# Patient Record
Sex: Male | Born: 1980 | Race: White | Hispanic: No | Marital: Married | Smoking: Current every day smoker
Health system: Southern US, Community
[De-identification: ages and names within clinical notes are randomized; demographics above are authoritative.]

---

## 2004-03-24 ENCOUNTER — Emergency Department: Payer: Self-pay | Admitting: Emergency Medicine

## 2004-08-30 ENCOUNTER — Emergency Department: Payer: Self-pay | Admitting: Emergency Medicine

## 2011-08-29 ENCOUNTER — Emergency Department: Payer: Self-pay | Admitting: Emergency Medicine

## 2013-06-04 ENCOUNTER — Emergency Department: Payer: Self-pay | Admitting: Emergency Medicine

## 2014-11-16 ENCOUNTER — Ambulatory Visit: Payer: Self-pay | Admitting: Family Medicine

## 2014-12-03 ENCOUNTER — Ambulatory Visit: Payer: Self-pay | Admitting: Family Medicine

## 2019-08-06 ENCOUNTER — Emergency Department: Payer: Medicaid Other

## 2019-08-06 ENCOUNTER — Encounter: Payer: Self-pay | Admitting: Emergency Medicine

## 2019-08-06 ENCOUNTER — Other Ambulatory Visit: Payer: Self-pay

## 2019-08-06 ENCOUNTER — Emergency Department
Admission: EM | Admit: 2019-08-06 | Discharge: 2019-08-06 | Disposition: A | Discharge status: Home / Self Care | Payer: Medicaid Other | Attending: Student in an Organized Health Care Education/Training Program | Admitting: Student in an Organized Health Care Education/Training Program

## 2019-08-06 DIAGNOSIS — W208XXA Other cause of strike by thrown, projected or falling object, initial encounter: Secondary | ICD-10-CM | POA: Insufficient documentation

## 2019-08-06 DIAGNOSIS — Y929 Unspecified place or not applicable: Secondary | ICD-10-CM | POA: Insufficient documentation

## 2019-08-06 DIAGNOSIS — F17210 Nicotine dependence, cigarettes, uncomplicated: Secondary | ICD-10-CM | POA: Diagnosis not present

## 2019-08-06 DIAGNOSIS — S9032XA Contusion of left foot, initial encounter: Secondary | ICD-10-CM | POA: Diagnosis not present

## 2019-08-06 DIAGNOSIS — Y9389 Activity, other specified: Secondary | ICD-10-CM | POA: Diagnosis not present

## 2019-08-06 DIAGNOSIS — Y999 Unspecified external cause status: Secondary | ICD-10-CM | POA: Diagnosis not present

## 2019-08-06 DIAGNOSIS — Z23 Encounter for immunization: Secondary | ICD-10-CM | POA: Insufficient documentation

## 2019-08-06 DIAGNOSIS — S99922A Unspecified injury of left foot, initial encounter: Secondary | ICD-10-CM | POA: Diagnosis present

## 2019-08-06 DIAGNOSIS — B353 Tinea pedis: Secondary | ICD-10-CM

## 2019-08-06 MED ORDER — CEPHALEXIN 500 MG PO CAPS: 1000.0000 mg | ORAL_CAPSULE | Freq: Two times a day (BID) | ORAL | 0 refills | Status: DC

## 2019-08-06 MED ORDER — TETANUS-DIPHTH-ACELL PERTUSSIS 5-2.5-18.5 LF-MCG/0.5 IM SUSP
0.5000 mL | Freq: Once | INTRAMUSCULAR | Status: AC
  Administered 2019-08-06: 0.5 mL via INTRAMUSCULAR
  Filled 2019-08-06: qty 0.5

## 2019-08-06 MED ORDER — MELOXICAM 15 MG PO TABS: 15.0000 mg | ORAL_TABLET | Freq: Every day | ORAL | 0 refills | Status: DC

## 2019-08-06 NOTE — Discharge Instructions (Signed)
Use athlete's foot cream for at least 6 months to the bottom of your foot.  This will contain an "azole" medication, likely ketoconazole.  Use this daily for at least 6 months.

## 2019-08-06 NOTE — ED Triage Notes (Signed)
Pt to ED from home c/o left foot injury tonight.  States was unloading a safe out of friend's car when it fell on foot.

## 2019-08-06 NOTE — ED Notes (Signed)
Called pharm as ED flex pyxis currently out of Tdap shots. State they will tube soon.

## 2019-08-06 NOTE — ED Provider Notes (Signed)
[facility] Emergency Department Provider Note  ____________________________________________  Time seen: Approximately 10:36 PM  I have reviewed the triage vital signs and the nursing notes.   HISTORY  Chief Complaint Foot Injury    HPI Luis Francis is a 39 y.o. male who presents the emergency department for evaluation of left foot injury.  Patient states that he was helping a buddy move a large safe door when it fell striking his foot.  Patient sustained an abrasion along the medial aspect of the foot, had bruising and pain diffusely through the midfoot.  Patient states that he was able to stand on it but doing so increase the pain.  No loss of sensation.  No other injury or complaint.  Patient is unsure of his last tetanus shot.         History reviewed. No pertinent past medical history.  There are no problems to display for this patient.   History reviewed. No pertinent surgical history.  Prior to Admission medications   Medication Sig Start Date End Date Taking? Authorizing Provider  cephALEXin (KEFLEX) 500 MG capsule Take 2 capsules (1,000 mg total) by mouth 2 (two) times daily. 08/06/19   Cuthriell, Charline Bills, PA-C  meloxicam (MOBIC) 15 MG tablet Take 1 tablet (15 mg total) by mouth daily. 08/06/19   Cuthriell, Charline Bills, PA-C    Allergies Patient has no known allergies.  History reviewed. No pertinent family history.  Social History Social History   Tobacco Use  . Smoking status: Current Every Day Smoker    Packs/day: 1.00    Types: Cigarettes  . Smokeless tobacco: Former Network engineer Use Topics  . Alcohol use: Never  . Drug use: Never     Review of Systems  Constitutional: No fever/chills Eyes: No visual changes. No discharge ENT: No upper respiratory complaints. Cardiovascular: no chest pain. Respiratory: no cough. No SOB. Gastrointestinal: No abdominal pain.  No nausea, no vomiting.  No diarrhea.  No  constipation. Musculoskeletal: Positive for left foot injury Skin: Negative for rash, abrasions, lacerations, ecchymosis. Neurological: Negative for headaches, focal weakness or numbness. 10-point ROS otherwise negative.  ____________________________________________   PHYSICAL EXAM:  VITAL SIGNS: ED Triage Vitals [08/06/19 2128]  Enc Vitals Group     BP 112/88     Pulse Rate (!) 116     Resp 18     Temp 98.9 F (37.2 C)     Temp Source Oral     SpO2 97 %     Weight 130 lb (59 kg)     Height 5\' 6"  (1.676 m)     Head Circumference      Peak Flow      Pain Score 10     Pain Loc      Pain Edu?      Excl. in [place]?      Constitutional: Alert and oriented. Well appearing and in no acute distress. Eyes: Conjunctivae are normal. PERRL. EOMI. Head: Atraumatic. ENT:      Ears:       Nose: No congestion/rhinnorhea.      Mouth/Throat: Mucous membranes are moist.  Neck: No stridor.    Cardiovascular: Normal rate, regular rhythm. Normal S1 and S2.  Good peripheral circulation. Respiratory: Normal respiratory effort without tachypnea or retractions. Lungs CTAB. Good air entry to the bases with no decreased or absent breath sounds. Musculoskeletal: Full range of motion to all extremities. No gross deformities appreciated.  Visualization of the left foot reveals  superficial abrasion along the medial aspect.  This is linear in nature and measures approximately 2 cm in length.  No bleeding, no foreign body.  Patient does have some mild edema and ecchymosis extending through the midfoot region.  No deformities.  Patient is able to move all digits appropriately.  Has significant tenderness diffusely across the metatarsals with no palpable abnormality.  Dorsalis pedis pulse and capillary refill intact.  Sensation intact all digits.  Incidental finding along the plantar aspect reveals findings consistent with tinea pedis. Neurologic:  Normal speech and language. No gross focal neurologic deficits  are appreciated.  Skin:  Skin is warm, dry and intact. No rash noted. Psychiatric: Mood and affect are normal. Speech and behavior are normal. Patient exhibits appropriate insight and judgement.   ____________________________________________   LABS (all labs ordered are listed, but only abnormal results are displayed)  Labs Reviewed - No data to display ____________________________________________  EKG   ____________________________________________  RADIOLOGY I personally viewed and evaluated these images as part of my medical decision making, as well as reviewing the written report by the radiologist.  DG Foot Complete Left  Result Date: 08/06/2019 CLINICAL DATA:  Left foot injury tonight EXAM: LEFT FOOT - COMPLETE 3+ VIEW COMPARISON:  None. FINDINGS: No fracture or dislocation. Normal bone mineralization seen throughout. Mild dorsal soft tissue swelling. IMPRESSION: No acute osseous abnormality. Electronically Signed   By: Jonna Clark M.D.   On: 08/06/2019 22:02    ____________________________________________    PROCEDURES  Procedure(s) performed:    Procedures    Medications  Tdap (BOOSTRIX) injection 0.5 mL (has no administration in time range)     ____________________________________________   INITIAL IMPRESSION / ASSESSMENT AND PLAN / ED COURSE  Pertinent labs & imaging results that were available during my care of the patient were reviewed by me and considered in my medical decision making (see chart for details).  Review of the [place] CSRS was performed in accordance of the [facility] prior to dispensing any controlled drugs.           Patient's diagnosis is consistent with foot contusion, tinea pedis.  Patient presented to the emergency department with complaint of left foot injury.  X-ray reveals no acute fractures.  Exam was otherwise reassuring.  Patient had a superficial laceration that did not require closure in the emergency department.  Patient will be  placed on antibiotics prophylactically in tetanus shot administered at this time.  Wound care instructions discussed with patient.  I will place the patient on meloxicam at home for symptom relief.  Patient had findings consistent with tinea pedis.  Thankfully this is localized to the plantar aspect and is not significant.  I advised the patient to use over-the-counter topical medication containing ketoconazole for improvement..  Patient is given ED precautions to return to the ED for any worsening or new symptoms.     ____________________________________________  FINAL CLINICAL IMPRESSION(S) / ED DIAGNOSES  Final diagnoses:  Contusion of left foot, initial encounter  Tinea pedis of left foot      NEW MEDICATIONS STARTED DURING THIS VISIT:  ED Discharge Orders         Ordered    meloxicam (MOBIC) 15 MG tablet  Daily     08/06/19 2248    cephALEXin (KEFLEX) 500 MG capsule  2 times daily     08/06/19 2248              This chart was dictated using voice recognition software/Dragon. Despite best  efforts to proofread, errors can occur which can change the meaning. Any change was purely unintentional.    Racheal Patches, PA-C 08/06/19 2249    Willy Eddy, MD 08/06/19 [phone]

## 2021-07-10 IMAGING — CR DG FOOT COMPLETE 3+V*L*
1 series · 3 of 3 positions shown · non-contrast
Comparison: None.

CLINICAL DATA: Left foot injury tonight

EXAM:
LEFT FOOT - COMPLETE 3+ VIEW

[Series 1: dg foot complete left · 0.14mm/px · 3 of 3 slices shown]
[im 1/3]
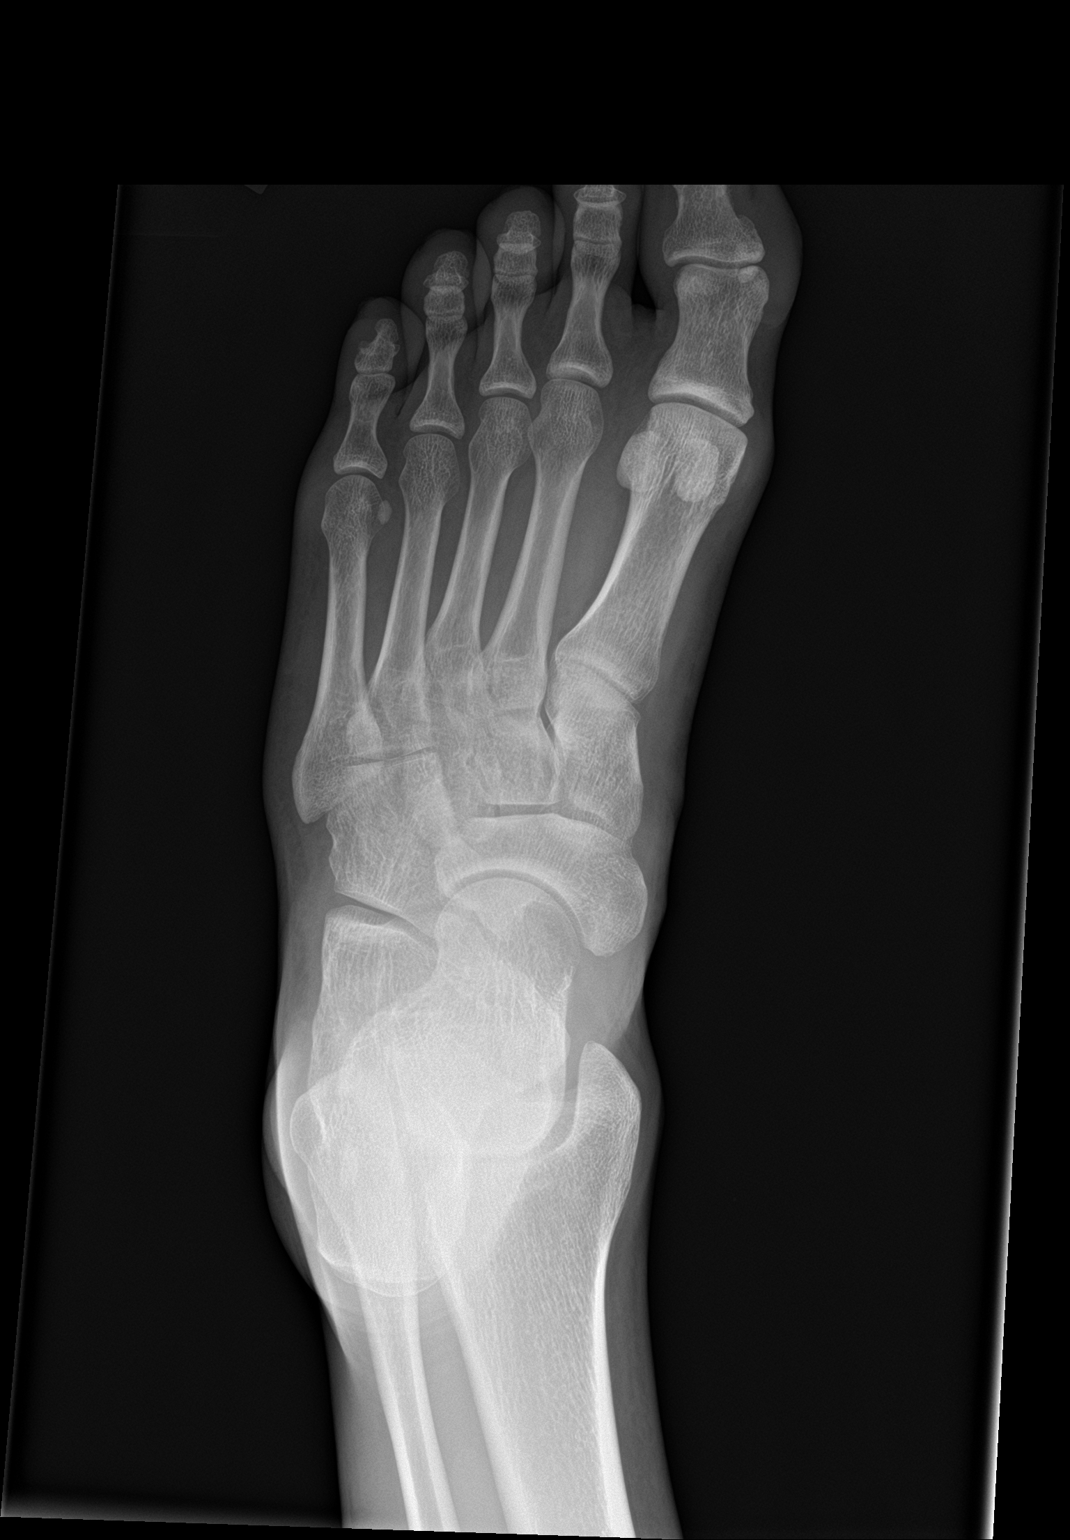
[im 2/3]
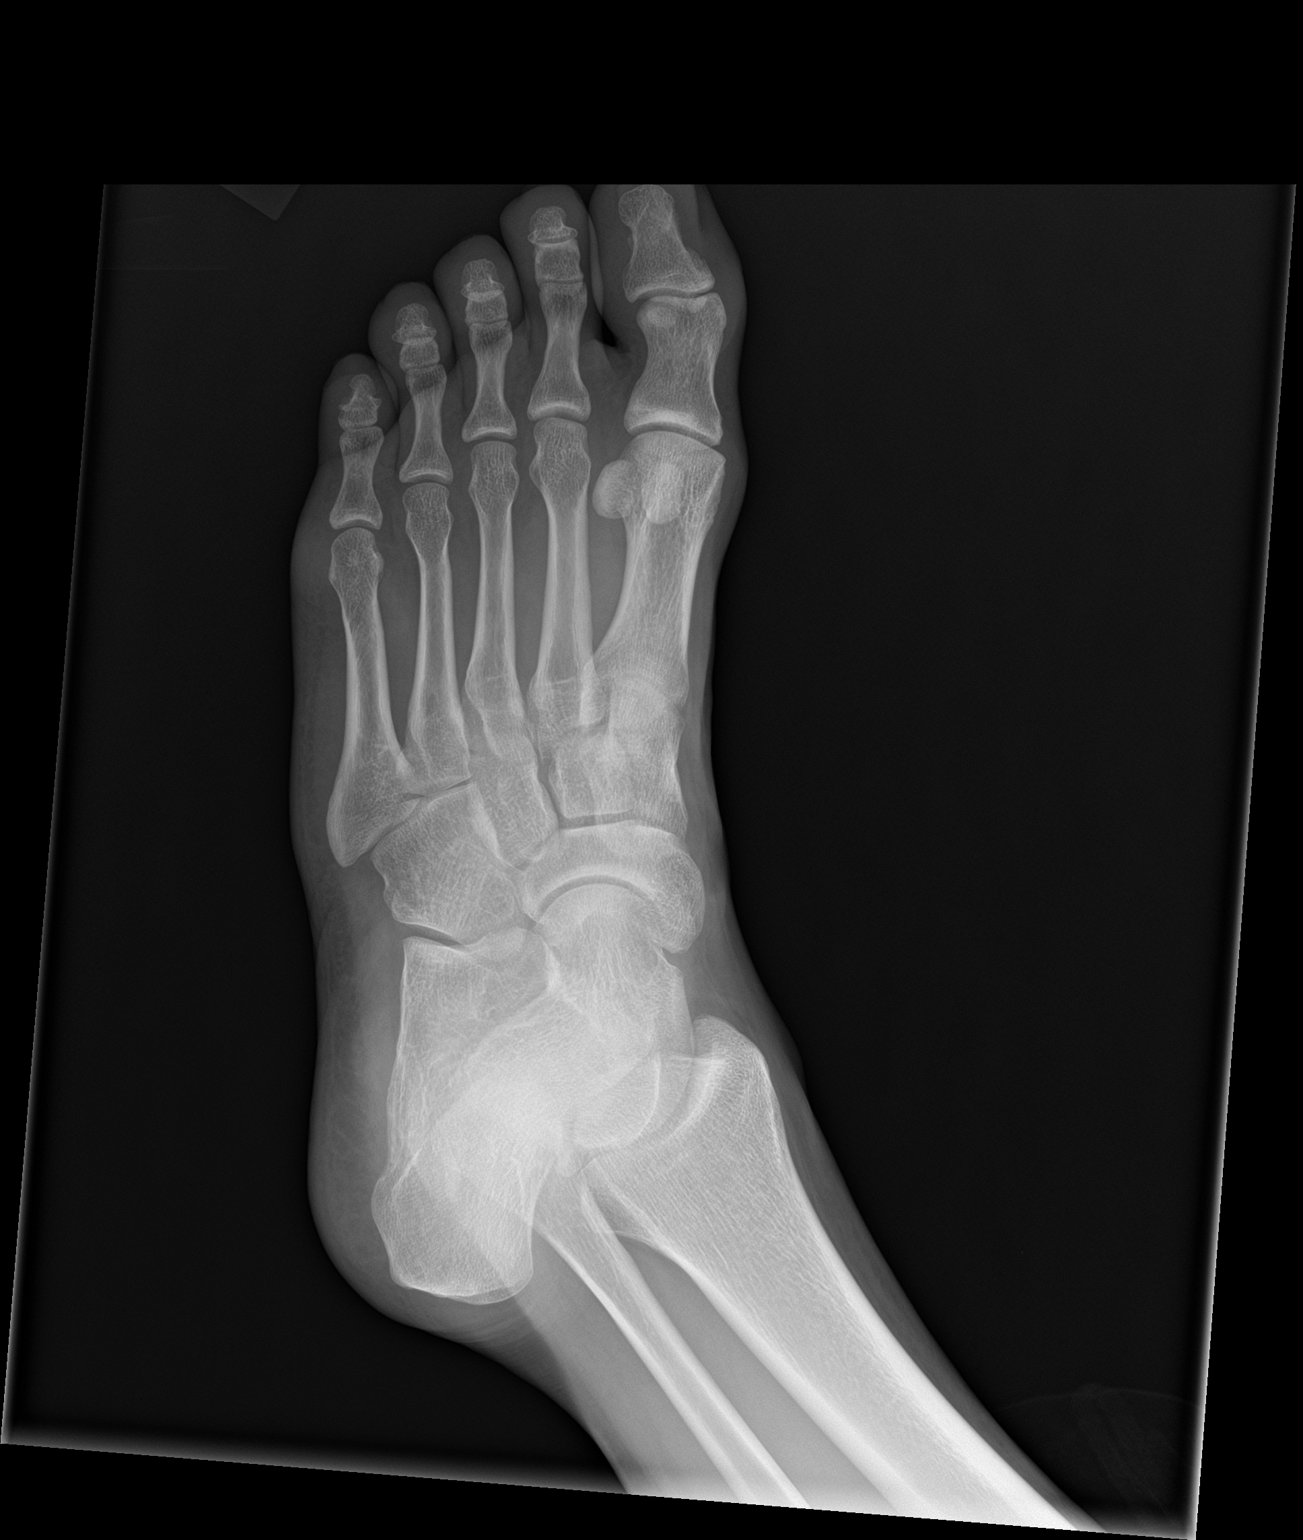
[im 3/3]
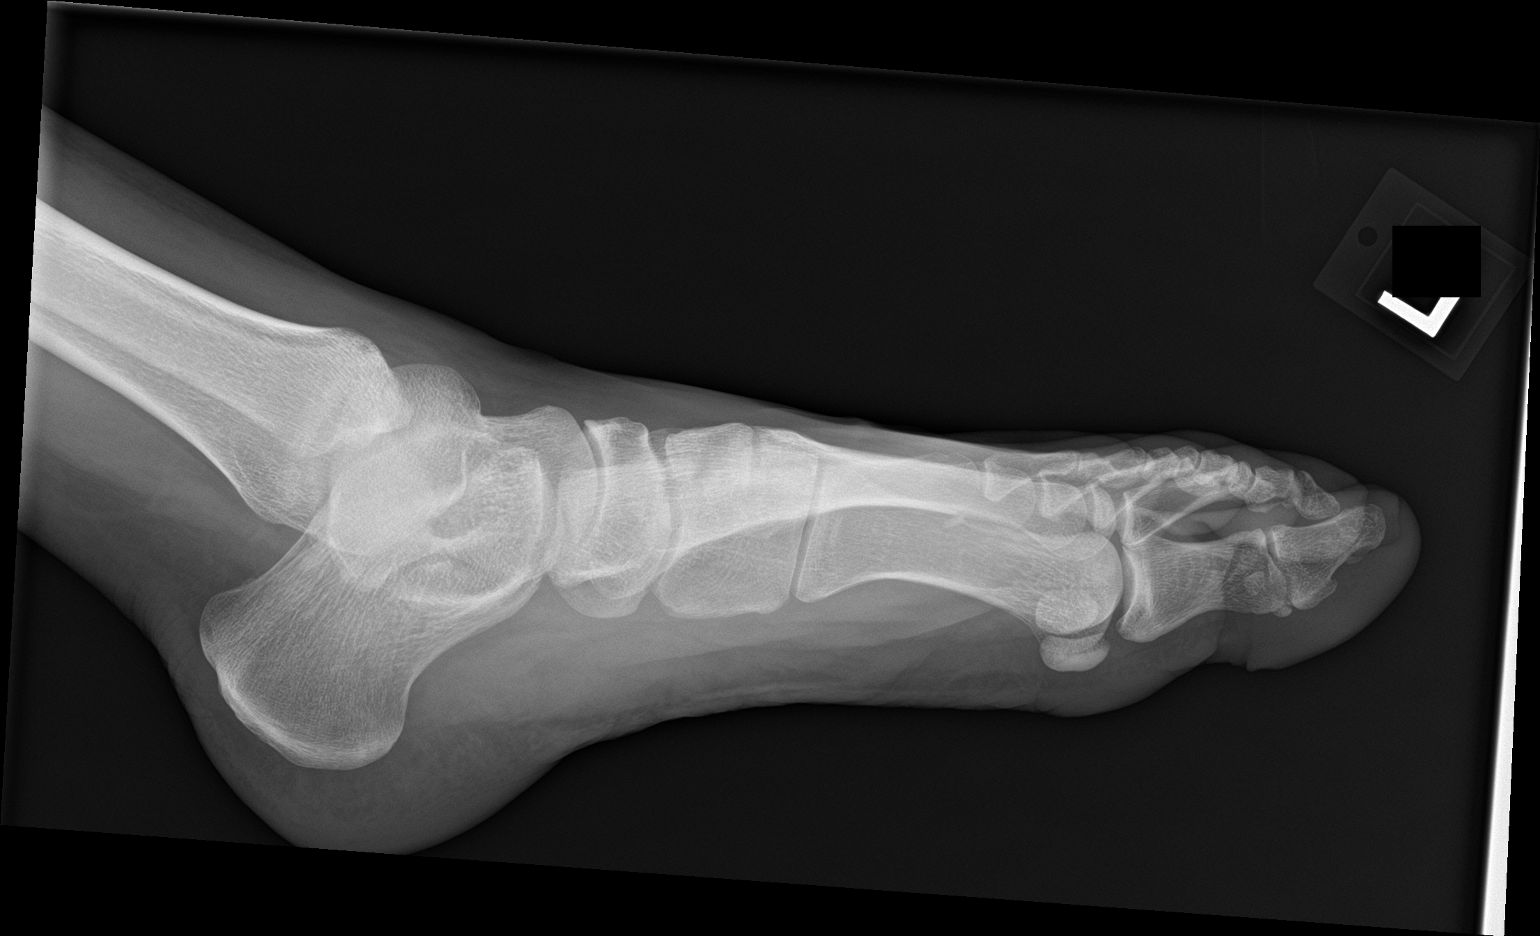

[3 of 3 positions shown; findings below may reference images not displayed]

FINDINGS: No fracture or dislocation. Normal bone mineralization seen
throughout. Mild dorsal soft tissue swelling.
IMPRESSION: No acute osseous abnormality.

## 2021-08-31 ENCOUNTER — Emergency Department: Payer: Medicaid Other

## 2021-08-31 ENCOUNTER — Telehealth: Payer: Self-pay | Admitting: Licensed Clinical Social Worker

## 2021-08-31 ENCOUNTER — Other Ambulatory Visit: Payer: Self-pay

## 2021-08-31 ENCOUNTER — Emergency Department
Admission: EM | Admit: 2021-08-31 | Discharge: 2021-08-31 | Disposition: A | Discharge status: Home / Self Care | Payer: Medicaid Other | Attending: Emergency Medicine | Admitting: Emergency Medicine

## 2021-08-31 DIAGNOSIS — S61411A Laceration without foreign body of right hand, initial encounter: Secondary | ICD-10-CM | POA: Diagnosis not present

## 2021-08-31 DIAGNOSIS — S6991XA Unspecified injury of right wrist, hand and finger(s), initial encounter: Secondary | ICD-10-CM | POA: Diagnosis present

## 2021-08-31 DIAGNOSIS — W268XXA Contact with other sharp object(s), not elsewhere classified, initial encounter: Secondary | ICD-10-CM | POA: Diagnosis not present

## 2021-08-31 DIAGNOSIS — Y92019 Unspecified place in single-family (private) house as the place of occurrence of the external cause: Secondary | ICD-10-CM | POA: Insufficient documentation

## 2021-08-31 MED ORDER — LIDOCAINE HCL (PF) 1 % IJ SOLN
5.0000 mL | Freq: Once | INTRAMUSCULAR | Status: AC
  Administered 2021-08-31: 5 mL
  Filled 2021-08-31: qty 5

## 2021-08-31 NOTE — Telephone Encounter (Signed)
Patient returned call. LCSW provided information on service options and provided [facility]'s phone number. ?

## 2021-08-31 NOTE — Telephone Encounter (Addendum)
From Thurmond Butts "Luis Francis this patient wants your services and I did tell him that you don't prescribe any medications and he understood that he said he just wants someone to talk to. " ? ?LCSW attempted to call patient and left VM leaving call back number and number to [place].  ?

## 2021-08-31 NOTE — ED Triage Notes (Signed)
Pt states he was slapping at something and the corner jammed into the right palm, bleeding controlled. ?

## 2021-08-31 NOTE — Discharge Instructions (Signed)
.    Clean and dry and watch for any signs of infection.  You may go to your primary care provider or urgent care in 10 to 12 days to have sutures removed.  Clean daily with mild soap and water and allowed to dry completely before covering it.  While you are watching TV at home you can leave it open.  Tylenol or ibuprofen as needed for pain. ?

## 2021-08-31 NOTE — ED Notes (Signed)
Pt hit right hand against corner of dresser. Pt with clean bandage on right hand, no drainage noted.  ?

## 2021-08-31 NOTE — ED Provider Notes (Signed)
? ?[facility] ?Provider Note ? ? ? Event Date/Time  ? First MD Initiated Contact with Patient 08/31/21 [id number]   ?  (approximate) ? ? ?History  ? ?Puncture Wound ? ? ?HPI ? ?Luis Francis is a 41 y.o. male presents to the ED with complaint of right hand injury that happened this morning when he slapped the edge of furniture at his home.  Patient describes a puncture wound.  He states that his tetanus was updated 1 year ago.  He states he feels as if something in his hand broke during this.  He rates his pain as 9/10. ?  ? ? ?Physical Exam  ? ?Triage Vital Signs: ?ED Triage Vitals  ?Enc Vitals Group  ?   BP 08/31/21 0732 112/78  ?   Pulse Rate 08/31/21 0732 88  ?   Resp 08/31/21 0732 15  ?   Temp 08/31/21 0732 97.9 ?F (36.6 ?C)  ?   Temp Source 08/31/21 0732 Oral  ?   SpO2 08/31/21 0732 98 %  ?   Weight --   ?   Height --   ?   Head Circumference --   ?   Peak Flow --   ?   Pain Score 08/31/21 0727 9  ?   Pain Loc --   ?   Pain Edu? --   ?   Excl. in [place]? --   ? ? ?Most recent vital signs: ?Vitals:  ? 08/31/21 0732  ?BP: 112/78  ?Pulse: 88  ?Resp: 15  ?Temp: 97.9 ?F (36.6 ?C)  ?SpO2: 98%  ? ? ? ?General: Awake, no distress.  ?CV:  Good peripheral perfusion.  ?Resp:  Normal effort.  ?Abd:  No distention.  ?Other:  Right palm patient has a L-shaped laceration without active leading.  No foreign body present. ? ? ?ED Results / Procedures / Treatments  ? ?Labs ?(all labs ordered are listed, but only abnormal results are displayed) ?Labs Reviewed - No data to display ? ? ? ?RADIOLOGY ?X-ray right hand images were reviewed by myself independently of the radiologist and no opaque foreign body or fracture was noted.  Radiology report confirms the same. ? ? ? ?PROCEDURES: ? ?Critical Care performed:  ? ?Marland Kitchen.Laceration Repair ? ?Date/Time: 08/31/2021 8:31 AM ?Performed by: Tommi Rumps, PA-C ?Authorized by: Tommi Rumps, PA-C  ? ?Consent:  ?  Consent obtained:  Verbal ?  Consent given by:   Patient ?  Risks discussed:  Infection, pain and poor wound healing ?Anesthesia:  ?  Anesthesia method:  Local infiltration ?  Local anesthetic:  Lidocaine 1% w/o epi ?Laceration details:  ?  Location:  Hand ?  Hand location:  R palm ?  Length (cm):  2.5 ?Pre-procedure details:  ?  Preparation:  Patient was prepped and draped in usual sterile fashion and imaging obtained to evaluate for foreign bodies ?Exploration:  ?  Limited defect created (wound extended): no   ?  Hemostasis achieved with:  Direct pressure ?  Imaging obtained: x-ray   ?  Imaging outcome: foreign body not noted   ?  Wound exploration: wound explored through full range of motion and entire depth of wound visualized   ?  Contaminated: no   ?Treatment:  ?  Area cleansed with:  Saline ?  Amount of cleaning:  Extensive ?  Irrigation solution:  Sterile saline ?  Irrigation volume:  80 ml ?  Irrigation method:  Syringe ?  Visualized foreign bodies/material  removed: no   ?  Debridement:  None ?Skin repair:  ?  Repair method:  Sutures ?  Suture size:  4-0 ?  Suture material:  Nylon ?  Suture technique:  Simple interrupted ?  Number of sutures:  4 ?Approximation:  ?  Approximation:  Close ?Repair type:  ?  Repair type:  Simple ?Post-procedure details:  ?  Dressing:  Non-adherent dressing ?  Procedure completion:  Tolerated well, no immediate complications ? ? ?MEDICATIONS ORDERED IN ED: ?Medications  ?lidocaine (PF) (XYLOCAINE) 1 % injection 5 mL (5 mLs Infiltration Given 08/31/21 0847)  ? ? ? ?IMPRESSION / MDM / ASSESSMENT AND PLAN / ED COURSE  ?I reviewed the triage vital signs and the nursing notes. ? ? ?Differential diagnosis includes, but is not limited to, laceration right hand. ? ?41 year old male presents to the ED with laceration to his right hand when he struck the edge of a piece of furniture this morning.  Patient x-ray was reviewed no foreign body or fracture was noted.  Area was cleaned with copious amounts of saline and sutured.  Patient did  extremely well.  He is aware that he needs to keep this area clean and dry and watch for any signs of infection.  He was given instructions on how to care for this and also to have the sutures removed in approximately 10 to 12 days. ? ? ? ?  ? ? ?FINAL CLINICAL IMPRESSION(S) / ED DIAGNOSES  ? ?Final diagnoses:  ?Laceration of right hand without foreign body, initial encounter  ? ? ? ?Rx / DC Orders  ? ?ED Discharge Orders   ? ? None  ? ?  ? ? ? ?Note:  This document was prepared using Dragon voice recognition software and may include unintentional dictation errors. ?  ?Tommi Rumps, PA-C ?08/31/21 1536 ? ?  ?Gilles Chiquito, MD ?08/31/21 1615 ? ?

## 2022-08-03 ENCOUNTER — Emergency Department: Payer: Medicaid Other

## 2022-08-03 ENCOUNTER — Emergency Department
Admission: EM | Admit: 2022-08-03 | Discharge: 2022-08-03 | Disposition: A | Discharge status: Home / Self Care | Payer: Medicaid Other | Attending: Emergency Medicine | Admitting: Emergency Medicine

## 2022-08-03 ENCOUNTER — Other Ambulatory Visit: Payer: Self-pay

## 2022-08-03 DIAGNOSIS — R21 Rash and other nonspecific skin eruption: Secondary | ICD-10-CM

## 2022-08-03 DIAGNOSIS — L409 Psoriasis, unspecified: Secondary | ICD-10-CM | POA: Insufficient documentation

## 2022-08-03 DIAGNOSIS — M545 Low back pain, unspecified: Secondary | ICD-10-CM | POA: Insufficient documentation

## 2022-08-03 LAB — URINALYSIS, ROUTINE W REFLEX MICROSCOPIC
Bacteria, UA: NONE SEEN
Bilirubin Urine: NEGATIVE
Glucose, UA: NEGATIVE mg/dL
Hgb urine dipstick: NEGATIVE
Ketones, ur: NEGATIVE mg/dL
Leukocytes,Ua: NEGATIVE
Nitrite: NEGATIVE
Protein, ur: NEGATIVE mg/dL
Specific Gravity, Urine: 1.021 (ref 1.005–1.030)
pH: 5 (ref 5.0–8.0)

## 2022-08-03 MED ORDER — CYCLOBENZAPRINE HCL 10 MG PO TABS
10.0000 mg | ORAL_TABLET | Freq: Once | ORAL | Status: AC
  Administered 2022-08-03: 10 mg via ORAL
  Filled 2022-08-03: qty 1

## 2022-08-03 MED ORDER — BACLOFEN 10 MG PO TABS: 10.0000 mg | ORAL_TABLET | Freq: Three times a day (TID) | ORAL | 0 refills | Status: AC

## 2022-08-03 MED ORDER — PREDNISONE 10 MG (21) PO TBPK: ORAL_TABLET | ORAL | 0 refills | Status: AC

## 2022-08-03 MED ORDER — TRIAMCINOLONE ACETONIDE 0.5 % EX OINT: 1.0000 | TOPICAL_OINTMENT | Freq: Two times a day (BID) | CUTANEOUS | 0 refills | Status: AC

## 2022-08-03 MED ORDER — LIDOCAINE 5 % EX PTCH
1.0000 | MEDICATED_PATCH | CUTANEOUS | Status: DC
  Administered 2022-08-03: 1 via TRANSDERMAL
  Filled 2022-08-03: qty 1

## 2022-08-03 MED ORDER — KETOROLAC TROMETHAMINE 30 MG/ML IJ SOLN
30.0000 mg | Freq: Once | INTRAMUSCULAR | Status: AC
  Administered 2022-08-03: 30 mg via INTRAMUSCULAR
  Filled 2022-08-03: qty 1

## 2022-08-03 NOTE — ED Notes (Signed)
Pt ambulated to the restroom.

## 2022-08-03 NOTE — ED Triage Notes (Signed)
Pt to ED via POV from home. Pt reports 2 days ago he was pulling a tire and taking the bolts off. Pt states later that night he started having lower back pain that intermittently wraps around to his groin. Pt denies urinary symptoms or hx of kidney stones.

## 2022-08-03 NOTE — ED Provider Notes (Signed)
[facility] Provider Note    Event Date/Time   First MD Initiated Contact with Patient 08/03/22 1014     (approximate)   History   Back Pain   HPI  Luis Francis is a 42 y.o. male with no significant past medical history presents emergency department with low back pain.  Patient states that this he was working on a tire and had difficulty with the lug nuts.  States he had to work on the tire for over 30 minutes.  States he did not have pain immediately but had increased pain last night.  Worsening pain today.  Did take ibuprofen but did not have a lot of relief.  Also concerned about a rash on his penis      Physical Exam   Triage Vital Signs: ED Triage Vitals [08/03/22 0953]  Enc Vitals Group     BP 127/87     Pulse Rate 90     Resp 18     Temp 97.6 F (36.4 C)     Temp Source Oral     SpO2 98 %     Weight 130 lb (59 kg)     Height 5\' 6"  (1.676 m)     Head Circumference      Peak Flow      Pain Score 10     Pain Loc      Pain Edu?      Excl. in [place]?     Most recent vital signs: Vitals:   08/03/22 0953  BP: 127/87  Pulse: 90  Resp: 18  Temp: 97.6 F (36.4 C)  SpO2: 98%     General: Awake, no distress.   CV:  Good peripheral perfusion. regular rate and  rhythm Resp:  Normal effort. Abd:  No distention.   Other:  Lumbar spine minimally tender, paravertebral muscle spasm, patient has decreased range of motion.  While in the exam room examining his back he suddenly had sharp pain in the center of his back that radiated down his leg.    ED Results / Procedures / Treatments   Labs (all labs ordered are listed, but only abnormal results are displayed) Labs Reviewed  URINALYSIS, ROUTINE W REFLEX MICROSCOPIC - Abnormal; Notable for the following components:      Result Value   Color, Urine YELLOW (*)    APPearance CLEAR (*)    All other components within normal limits  RPR     EKG     RADIOLOGY X-ray lumbar  spine    PROCEDURES:   Procedures   MEDICATIONS ORDERED IN ED: Medications  lidocaine (LIDODERM) 5 % 1 patch (1 patch Transdermal Patch Applied 08/03/22 1143)  ketorolac (TORADOL) 30 MG/ML injection 30 mg (30 mg Intramuscular Given 08/03/22 1144)  cyclobenzaprine (FLEXERIL) tablet 10 mg (10 mg Oral Given 08/03/22 1147)     IMPRESSION / MDM / ASSESSMENT AND PLAN / ED COURSE  I reviewed the triage vital signs and the nursing notes.                              Differential diagnosis includes, but is not limited to, ruptured disc, muscle strain, lumbar radiculopathy  Patient's presentation is most consistent with acute complicated illness / injury requiring diagnostic workup.   X-ray of the lumbar spine, patient was given Toradol 30 mg IM, Flexeril 10 mg p.o., and a Lidoderm patch.   X-ray lumbar spine independently  reviewed interpreted by me as being negative  On examination of the patient's penis for his rash it is roundish area of the scaly, differential would be psoriasis versus chancre for syphilis  Will do a RPR.  Explained to the patient this would not be back for 2 to 3 days.  He can look it up on [place].  Was given a prescription for triamcinolone cream, baclofen, Medrol Dosepak.  He is to apply ice.  I did discuss comfort measures.  Patient is in agreement treatment plan.  Discharged stable condition.   FINAL CLINICAL IMPRESSION(S) / ED DIAGNOSES   Final diagnoses:  Acute midline low back pain without sciatica  Psoriasis  Rash     Rx / DC Orders   ED Discharge Orders          Ordered    triamcinolone ointment (KENALOG) 0.5 %  2 times daily        08/03/22 1225    predniSONE (STERAPRED UNI-PAK 21 TAB) 10 MG (21) TBPK tablet        08/03/22 1225    baclofen (LIORESAL) 10 MG tablet  3 times daily        08/03/22 1225             Note:  This document was prepared using Dragon voice recognition software and may include unintentional  dictation errors.    Versie Starks, PA-C 08/03/22 1231    Blake Divine, MD 08/05/22 [phone]

## 2022-08-03 NOTE — ED Notes (Signed)
Pt transported to Xray. 

## 2022-08-04 LAB — RPR: RPR Ser Ql: NONREACTIVE

## 2023-06-10 ENCOUNTER — Emergency Department: Admission: EM | Admit: 2023-06-10 | Discharge: 2023-06-10 | Discharge status: Against Medical Advice | Payer: MEDICAID

## 2023-06-10 NOTE — ED Notes (Signed)
No answer when called several times from lobby

## 2023-08-05 IMAGING — DX DG HAND COMPLETE 3+V*R*
3 series · 3 of 3 positions shown · non-contrast
Comparison: None.

CLINICAL DATA: Puncture wound, patient hit right hand against
[place] off a dresser.

EXAM:
RIGHT HAND - COMPLETE 3+ VIEW

[hand ap]
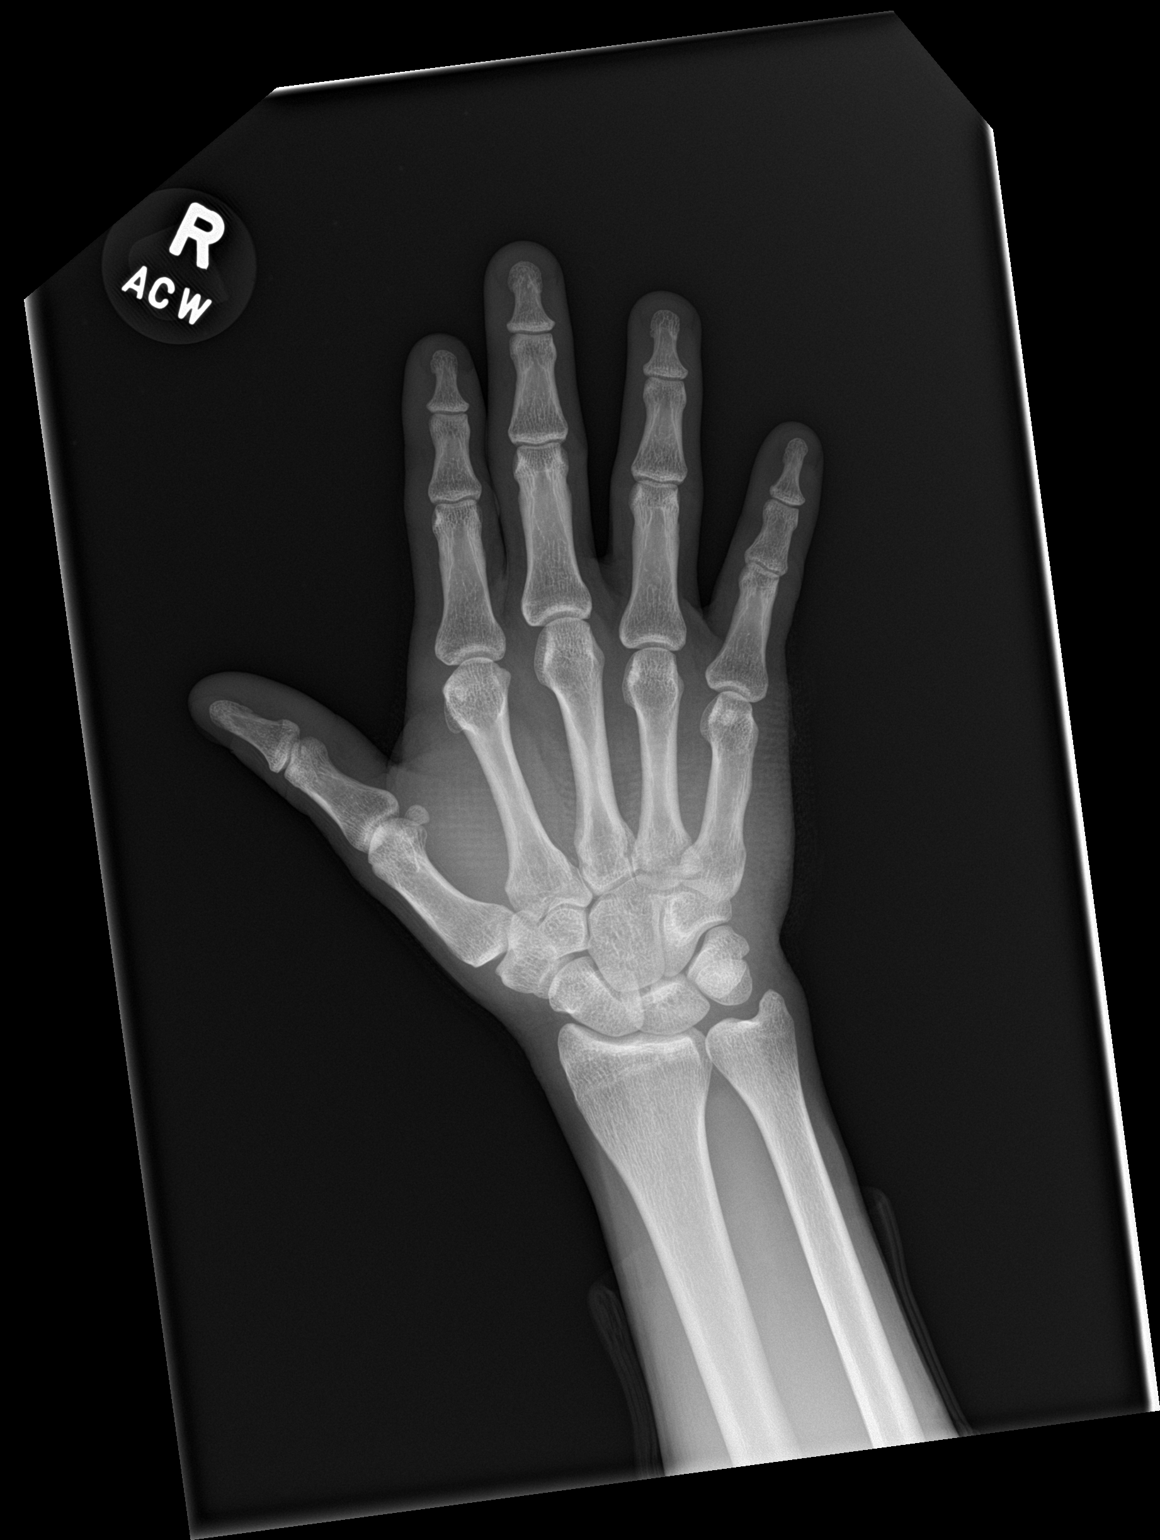

[hand obl]
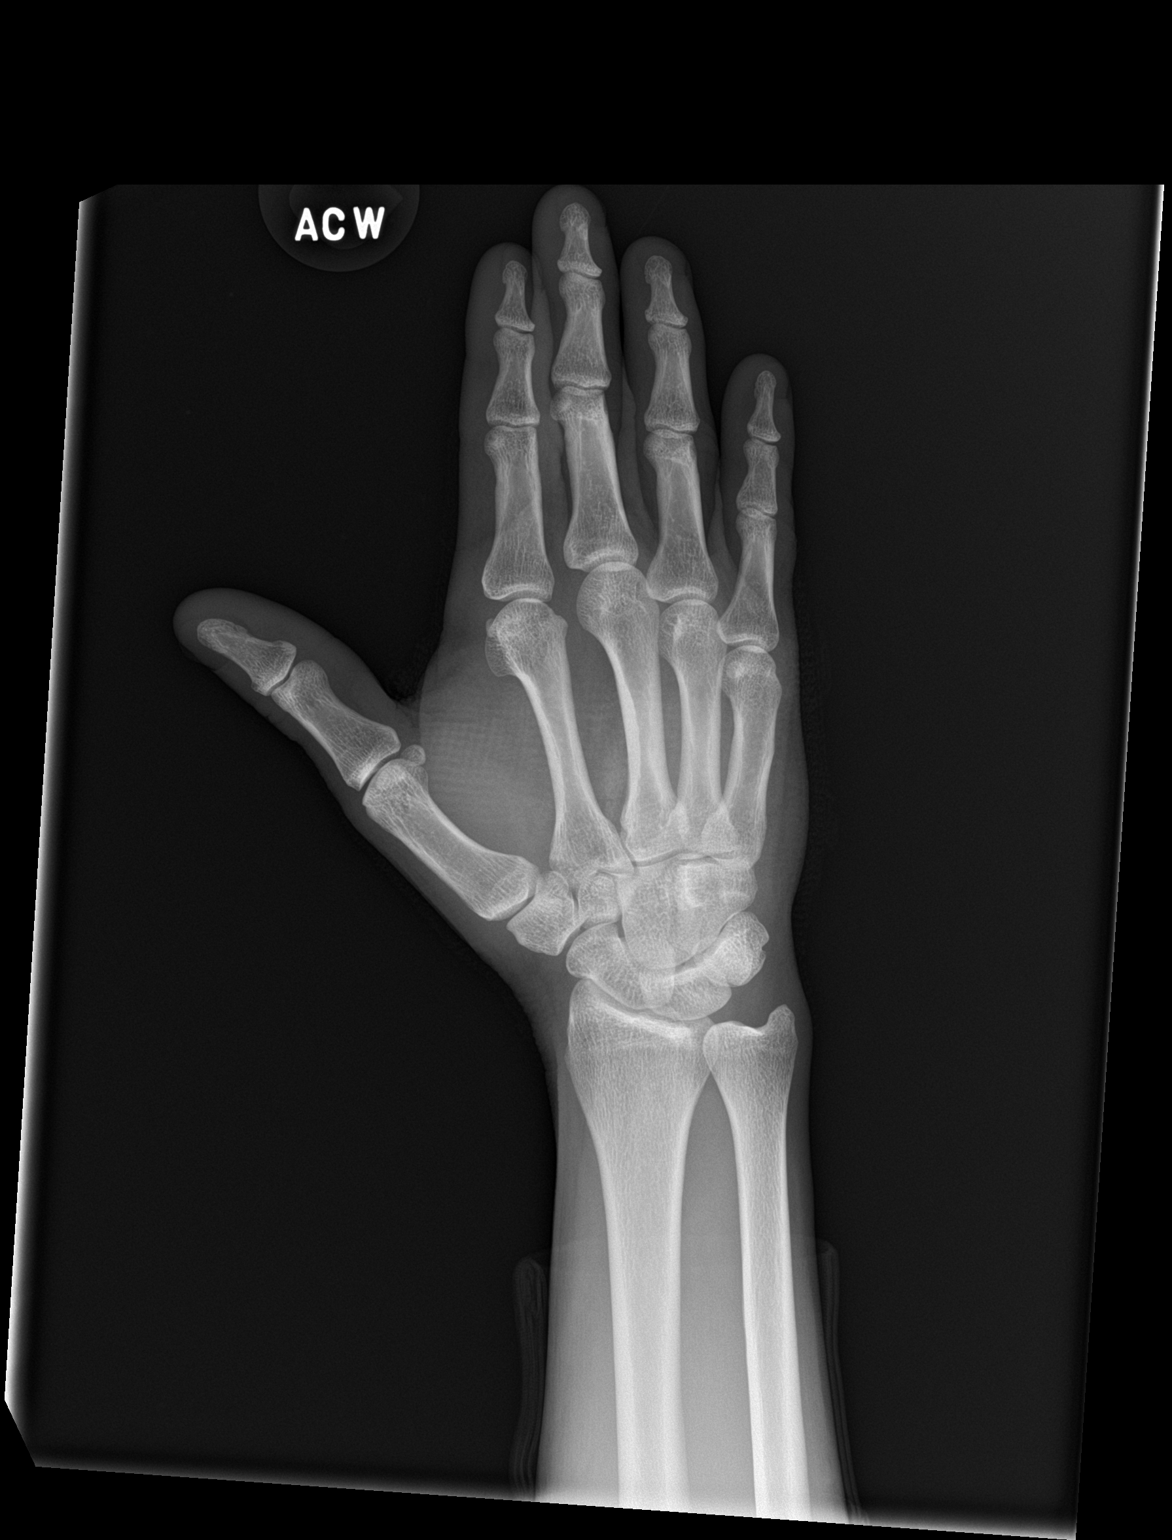

[hand lat]
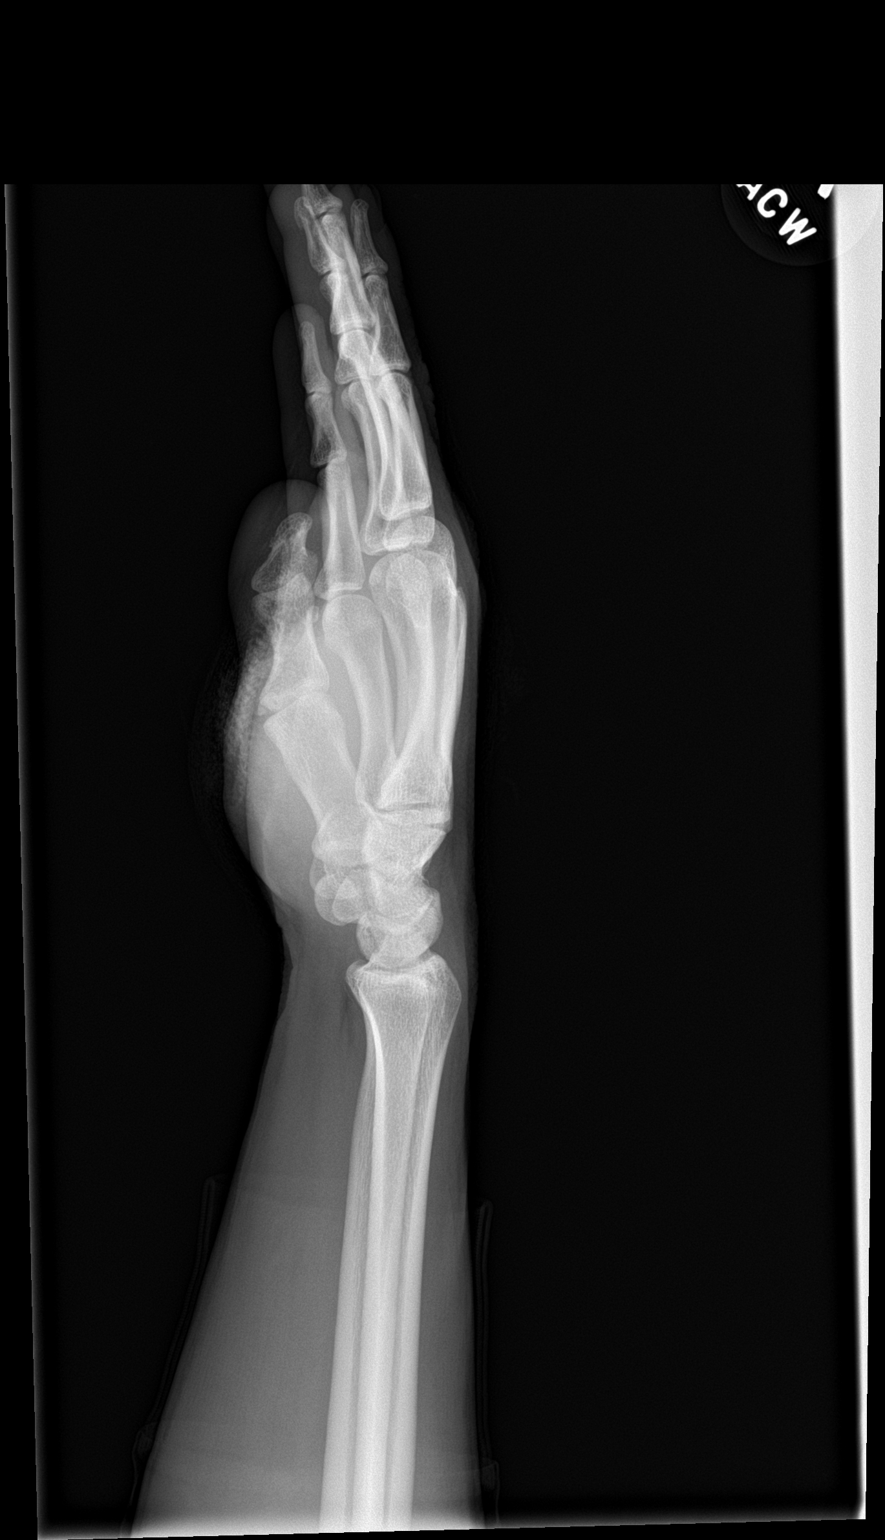

[3 of 3 positions shown; findings below may reference images not displayed]

FINDINGS: There is no evidence of fracture or dislocation. There is no
evidence of arthropathy or other focal bone abnormality. Soft
tissues are unremarkable. No radiopaque foreign body. Overlying
gauze/bandage about the palmar aspect of the hand.
IMPRESSION: No acute osseous abnormality or radiopaque foreign body.
